# Patient Record
Sex: Female | Born: 1970 | Race: Black or African American | Hispanic: No | Marital: Single
Health system: Southern US, Community
[De-identification: ages and names within clinical notes are randomized; demographics above are authoritative.]

---

## 2022-02-26 ENCOUNTER — Emergency Department
Admission: EM | Admit: 2022-02-26 | Discharge: 2022-02-26 | Disposition: A | Discharge status: Home / Self Care | Payer: 59 | Attending: Emergency Medicine | Admitting: Emergency Medicine

## 2022-02-26 ENCOUNTER — Emergency Department: Payer: 59

## 2022-02-26 ENCOUNTER — Other Ambulatory Visit: Payer: Self-pay

## 2022-02-26 DIAGNOSIS — J069 Acute upper respiratory infection, unspecified: Secondary | ICD-10-CM | POA: Insufficient documentation

## 2022-02-26 DIAGNOSIS — R059 Cough, unspecified: Secondary | ICD-10-CM | POA: Diagnosis present

## 2022-02-26 DIAGNOSIS — Z20822 Contact with and (suspected) exposure to covid-19: Secondary | ICD-10-CM | POA: Insufficient documentation

## 2022-02-26 LAB — RESP PANEL BY RT-PCR (FLU A&B, COVID) ARPGX2
Influenza A by PCR: NEGATIVE
Influenza B by PCR: NEGATIVE
SARS Coronavirus 2 by RT PCR: NEGATIVE

## 2022-02-26 MED ORDER — PSEUDOEPH-BROMPHEN-DM 30-2-10 MG/5ML PO SYRP: 5.0000 mL | ORAL_SOLUTION | Freq: Four times a day (QID) | ORAL | 0 refills | Status: AC | PRN

## 2022-02-26 NOTE — Discharge Instructions (Signed)
Your test was negative for COVID-19 and influenza.  Your chest x-ray was negative for pneumonia or bronchitis.  Read and follow discharge care instruction take medication as directed. ?

## 2022-02-26 NOTE — ED Notes (Signed)
Pt to ED states has productive cough and chest congestion and feels tired since 2 days ago. Hx bronchitis, current smoker. ?

## 2022-02-26 NOTE — ED Triage Notes (Signed)
Pt come with c/o cough and congestion since Friday. Pt states productive cough. ?

## 2022-02-26 NOTE — ED Provider Notes (Signed)
? ?  [facility] ?Provider Note ? ? ? Event Date/Time  ? First MD Initiated Contact with Patient 02/26/22 1743   ?  (approximate) ? ? ?History  ? ?Cough ? ? ?HPI ?Sandra Jones is a 51 y.o. female presents with productive cough for 2 days.  Denies recent travel or known contact with COVID-19.  Patient is not taking no COVID 19 vaccine or flu shot.  Patient use over-the-counter cough medicine and Vicks vapor rub for complaint. ?  ? ? ?Physical Exam  ? ?Triage Vital Signs: ?ED Triage Vitals  ?Enc Vitals Group  ?   BP 02/26/22 1715 139/89  ?   Pulse Rate 02/26/22 1715 (!) 102  ?   Resp 02/26/22 1715 17  ?   Temp 02/26/22 1715 100 ?F (37.8 ?C)  ?   Temp src --   ?   SpO2 02/26/22 1715 96 %  ?   Weight --   ?   Height --   ?   Head Circumference --   ?   Peak Flow --   ?   Pain Score 02/26/22 1714 4  ?   Pain Loc --   ?   Pain Edu? --   ?   Excl. in GC? --   ? ? ?Most recent vital signs: ?Vitals:  ? 02/26/22 1715  ?BP: 139/89  ?Pulse: (!) 102  ?Resp: 17  ?Temp: 100 ?F (37.8 ?C)  ?SpO2: 96%  ? ? ? ?General: Awake, no distress.  ?CV:  Good peripheral perfusion.  ?Resp:  Normal effort.  ?Abd:  No distention.  ?Other:  Productive cough. ? ? ?ED Results / Procedures / Treatments  ? ?Labs ?(all labs ordered are listed, but only abnormal results are displayed) ?Labs Reviewed  ?RESP PANEL BY RT-PCR (FLU A&B, COVID) [id number]  ? ? ? ?EKG ? ? ? ? ?RADIOLOGY ?No acute findings on chest x-ray. ?Radiology reveals no evidence of acute cardiopulmonary disease. ?PROCEDURES: ? ?Critical Care performed: No ? ?Procedures ? ? ?MEDICATIONS ORDERED IN ED: ?Medications - No data to display ? ? ?IMPRESSION / MDM / ASSESSMENT AND PLAN / ED COURSE  ?I reviewed the triage vital signs and the nursing notes. ?             ?               ? ?Differential diagnosis includes, but is not limited to, bronchitis, pneumonia, viral illness. ? ? ? ?FINAL CLINICAL IMPRESSION(S) / ED DIAGNOSES  ? ?Final diagnoses:  ?Viral URI with cough   ? ? ? ?Rx / DC Orders  ? ?ED Discharge Orders   ? ?      Ordered  ?  brompheniramine-pseudoephedrine-DM 30-2-10 MG/5ML syrup  4 times daily PRN       ? 02/26/22 1828  ? ?  ?  ? ?  ? ? ? ?Note:  This document was prepared using Dragon voice recognition software and may include unintentional dictation errors. ? ?  ?Joni Reining, PA-C ?02/26/22 1831 ? ?  ?Sharyn Creamer, MD ?02/26/22 2227 ? ?

## 2023-06-28 IMAGING — DX DG CHEST 1V PORT
1 series · 1 of 1 positions shown · non-contrast
Comparison: No pertinent prior exams available for comparison.

CLINICAL DATA: Productive cough for 2 days. No COVID or flu
vaccine. Provided history: Productive cough and chest congestion,
fatigue, symptoms for 2 days. History of bronchitis, current smoker.

EXAM:
PORTABLE CHEST 1 VIEW

[chest ap]
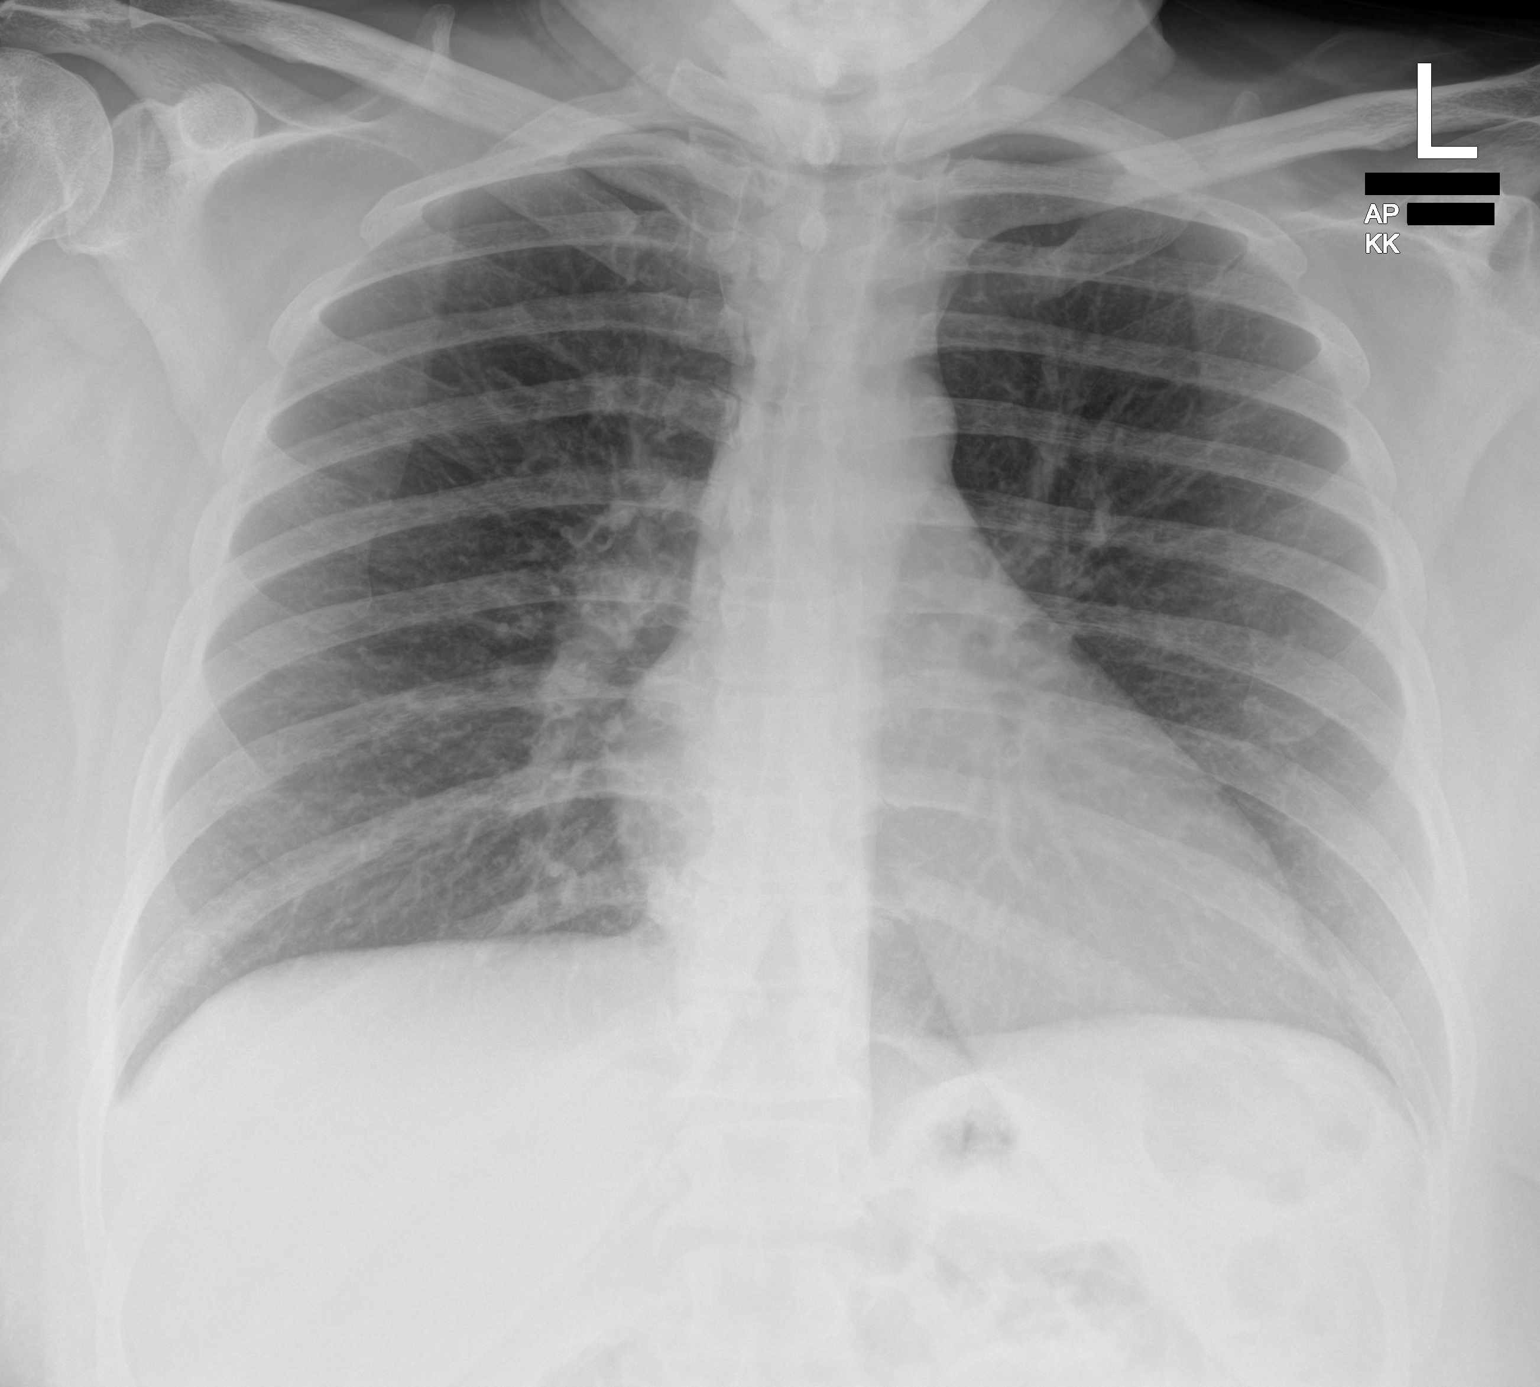

[1 of 1 positions shown; findings below may reference images not displayed]

FINDINGS: Heart size within normal limits. No appreciable airspace
consolidation. No evidence of pleural effusion or pneumothorax. No
acute bony abnormality identified.
IMPRESSION: No evidence of active cardiopulmonary disease.
